# Patient Record
Sex: Male | Born: 1950 | Race: White | Hispanic: No | Marital: Married
Health system: Southern US, Community
[De-identification: ages and names within clinical notes are randomized; demographics above are authoritative.]

---

## 2018-01-29 ENCOUNTER — Other Ambulatory Visit (HOSPITAL_COMMUNITY): Payer: Self-pay

## 2018-01-29 ENCOUNTER — Inpatient Hospital Stay
Admission: RE | Admit: 2018-01-29 | Discharge: 2018-02-14 | Disposition: A | Discharge status: Rehabilitation Facility | Payer: Self-pay | Source: Ambulatory Visit | Attending: Internal Medicine | Admitting: Internal Medicine

## 2018-01-29 DIAGNOSIS — Z978 Presence of other specified devices: Secondary | ICD-10-CM

## 2018-01-29 DIAGNOSIS — Z4659 Encounter for fitting and adjustment of other gastrointestinal appliance and device: Secondary | ICD-10-CM

## 2018-01-29 DIAGNOSIS — Z452 Encounter for adjustment and management of vascular access device: Secondary | ICD-10-CM

## 2018-01-29 LAB — VANCOMYCIN, TROUGH: Vancomycin Tr: <4 ug/mL — ABNORMAL LOW (ref 15–20)

## 2018-01-30 ENCOUNTER — Other Ambulatory Visit (HOSPITAL_COMMUNITY): Payer: Self-pay

## 2018-01-30 LAB — CBC WITH DIFFERENTIAL/PLATELET
BASOS ABS: 0 10*3/uL (ref 0.0–0.1)
Basophils Relative: 0 %
EOS PCT: 0 %
Eosinophils Absolute: 0 10*3/uL (ref 0.0–0.7)
HEMATOCRIT: 35.1 % — AB (ref 39.0–52.0)
Hemoglobin: 10.4 g/dL — ABNORMAL LOW (ref 13.0–17.0)
LYMPHS PCT: 3 %
Lymphs Abs: 0.5 10*3/uL — ABNORMAL LOW (ref 0.7–4.0)
MCH: 25.9 pg — ABNORMAL LOW (ref 26.0–34.0)
MCHC: 29.6 g/dL — ABNORMAL LOW (ref 30.0–36.0)
MCV: 87.3 fL (ref 78.0–100.0)
Monocytes Absolute: 0.4 10*3/uL (ref 0.1–1.0)
Monocytes Relative: 2 %
NEUTROS ABS: 14.4 10*3/uL — AB (ref 1.7–7.7)
Neutrophils Relative %: 95 %
PLATELETS: 136 10*3/uL — AB (ref 150–400)
RBC: 4.02 MIL/uL — AB (ref 4.22–5.81)
RDW: 16.5 % — ABNORMAL HIGH (ref 11.5–15.5)
WBC: 15.2 10*3/uL — AB (ref 4.0–10.5)

## 2018-01-30 LAB — COMPREHENSIVE METABOLIC PANEL
ALT: 18 U/L (ref 17–63)
AST: 17 U/L (ref 15–41)
Albumin: 2.2 g/dL — ABNORMAL LOW (ref 3.5–5.0)
Alkaline Phosphatase: 59 U/L (ref 38–126)
Anion gap: 9 (ref 5–15)
BUN: 29 mg/dL — AB (ref 6–20)
CHLORIDE: 106 mmol/L (ref 101–111)
CO2: 28 mmol/L (ref 22–32)
CREATININE: 0.77 mg/dL (ref 0.61–1.24)
Calcium: 8.4 mg/dL — ABNORMAL LOW (ref 8.9–10.3)
GFR calc Af Amer: >60 mL/min (ref >60)
Glucose, Bld: 99 mg/dL (ref 65–99)
Potassium: 3.9 mmol/L (ref 3.5–5.1)
SODIUM: 143 mmol/L (ref 135–145)
Total Bilirubin: 0.7 mg/dL (ref 0.3–1.2)
Total Protein: 4.8 g/dL — ABNORMAL LOW (ref 6.5–8.1)

## 2018-01-30 LAB — CBC
HCT: 35.7 % — ABNORMAL LOW (ref 39.0–52.0)
Hemoglobin: 10.6 g/dL — ABNORMAL LOW (ref 13.0–17.0)
MCH: 26 pg (ref 26.0–34.0)
MCHC: 29.7 g/dL — AB (ref 30.0–36.0)
MCV: 87.5 fL (ref 78.0–100.0)
PLATELETS: 170 10*3/uL (ref 150–400)
RBC: 4.08 MIL/uL — ABNORMAL LOW (ref 4.22–5.81)
RDW: 16.6 % — AB (ref 11.5–15.5)
WBC: 15.1 10*3/uL — ABNORMAL HIGH (ref 4.0–10.5)

## 2018-01-30 LAB — PROTIME-INR
INR: 0.99
INR: 1.01
Prothrombin Time: 13 seconds (ref 11.4–15.2)
Prothrombin Time: 13.2 seconds (ref 11.4–15.2)

## 2018-01-30 LAB — APTT: aPTT: 24 seconds (ref 24–36)

## 2018-01-30 LAB — HEPARIN LEVEL (UNFRACTIONATED)
Heparin Unfractionated: 0.11 IU/mL — ABNORMAL LOW (ref 0.30–0.70)
Heparin Unfractionated: 0.63 IU/mL (ref 0.30–0.70)

## 2018-01-30 MED ORDER — IOPAMIDOL (ISOVUE-300) INJECTION 61%
INTRAVENOUS | Status: AC
  Filled 2018-01-30: qty 100

## 2018-01-30 MED ORDER — IOPAMIDOL (ISOVUE-300) INJECTION 61%
INTRAVENOUS | Status: AC
  Administered 2018-01-30: 75 mL
  Filled 2018-01-30: qty 75

## 2018-01-31 LAB — CBC
HCT: 31.7 % — ABNORMAL LOW (ref 39.0–52.0)
HEMOGLOBIN: 9.7 g/dL — AB (ref 13.0–17.0)
MCH: 26.2 pg (ref 26.0–34.0)
MCHC: 30.6 g/dL (ref 30.0–36.0)
MCV: 85.7 fL (ref 78.0–100.0)
PLATELETS: 100 10*3/uL — AB (ref 150–400)
RBC: 3.7 MIL/uL — ABNORMAL LOW (ref 4.22–5.81)
RDW: 16.8 % — ABNORMAL HIGH (ref 11.5–15.5)
WBC: 13.3 10*3/uL — ABNORMAL HIGH (ref 4.0–10.5)

## 2018-01-31 LAB — HEPARIN LEVEL (UNFRACTIONATED)
HEPARIN UNFRACTIONATED: 0.82 [IU]/mL — AB (ref 0.30–0.70)
HEPARIN UNFRACTIONATED: 0.92 [IU]/mL — AB (ref 0.30–0.70)
HEPARIN UNFRACTIONATED: 1.1 [IU]/mL — AB (ref 0.30–0.70)
Heparin Unfractionated: <0.1 IU/mL — ABNORMAL LOW (ref 0.30–0.70)

## 2018-02-01 LAB — CBC
HCT: 33 % — ABNORMAL LOW (ref 39.0–52.0)
HEMOGLOBIN: 10 g/dL — AB (ref 13.0–17.0)
MCH: 26 pg (ref 26.0–34.0)
MCHC: 30.3 g/dL (ref 30.0–36.0)
MCV: 85.7 fL (ref 78.0–100.0)
Platelets: 73 10*3/uL — ABNORMAL LOW (ref 150–400)
RBC: 3.85 MIL/uL — ABNORMAL LOW (ref 4.22–5.81)
RDW: 16.8 % — ABNORMAL HIGH (ref 11.5–15.5)
WBC: 13.2 10*3/uL — AB (ref 4.0–10.5)

## 2018-02-01 LAB — HEPARIN LEVEL (UNFRACTIONATED): Heparin Unfractionated: <0.1 IU/mL — ABNORMAL LOW (ref 0.30–0.70)

## 2018-02-01 LAB — HEPARIN INDUCED PLATELET AB (HIT ANTIBODY): HEPARIN INDUCED PLT AB: 0.502 {OD_unit} — AB (ref 0.000–0.400)

## 2018-02-02 ENCOUNTER — Other Ambulatory Visit (HOSPITAL_COMMUNITY): Payer: Self-pay

## 2018-02-02 LAB — CBC WITH DIFFERENTIAL/PLATELET
BASOS PCT: 0 %
Basophils Absolute: 0 10*3/uL (ref 0.0–0.1)
Eosinophils Absolute: 0.1 10*3/uL (ref 0.0–0.7)
Eosinophils Relative: 1 %
HCT: 26.7 % — ABNORMAL LOW (ref 39.0–52.0)
Hemoglobin: 7.8 g/dL — ABNORMAL LOW (ref 13.0–17.0)
LYMPHS PCT: 10 %
Lymphs Abs: 0.7 10*3/uL (ref 0.7–4.0)
MCH: 25.5 pg — ABNORMAL LOW (ref 26.0–34.0)
MCHC: 29.2 g/dL — AB (ref 30.0–36.0)
MCV: 87.3 fL (ref 78.0–100.0)
MONO ABS: 0.3 10*3/uL (ref 0.1–1.0)
MONOS PCT: 5 %
NEUTROS ABS: 5.7 10*3/uL (ref 1.7–7.7)
Neutrophils Relative %: 84 %
Platelets: 68 10*3/uL — ABNORMAL LOW (ref 150–400)
RBC: 3.06 MIL/uL — ABNORMAL LOW (ref 4.22–5.81)
RDW: 16.6 % — AB (ref 11.5–15.5)
WBC: 6.9 10*3/uL (ref 4.0–10.5)

## 2018-02-02 LAB — CREATININE, SERUM
Creatinine, Ser: 0.87 mg/dL (ref 0.61–1.24)
GFR calc Af Amer: >60 mL/min (ref >60)
GFR calc non Af Amer: >60 mL/min (ref >60)

## 2018-02-02 LAB — HEPARIN INDUCED PLATELET AB (HIT ANTIBODY): HEPARIN INDUCED PLT AB: 0.438 {OD_unit} — AB (ref 0.000–0.400)

## 2018-02-02 LAB — APTT: aPTT: 30 seconds (ref 24–36)

## 2018-02-03 LAB — CBC
HCT: 27.1 % — ABNORMAL LOW (ref 39.0–52.0)
Hemoglobin: 8 g/dL — ABNORMAL LOW (ref 13.0–17.0)
MCH: 25.7 pg — ABNORMAL LOW (ref 26.0–34.0)
MCHC: 29.5 g/dL — ABNORMAL LOW (ref 30.0–36.0)
MCV: 87.1 fL (ref 78.0–100.0)
Platelets: 65 10*3/uL — ABNORMAL LOW (ref 150–400)
RBC: 3.11 MIL/uL — AB (ref 4.22–5.81)
RDW: 16.7 % — ABNORMAL HIGH (ref 11.5–15.5)
WBC: 5.2 10*3/uL (ref 4.0–10.5)

## 2018-02-03 LAB — APTT
APTT: 78 s — AB (ref 24–36)
APTT: 85 s — AB (ref 24–36)
APTT: 71 s — AB (ref 24–36)
APTT: 76 s — AB (ref 24–36)
aPTT: 81 seconds — ABNORMAL HIGH (ref 24–36)

## 2018-02-03 LAB — PROTIME-INR
INR: 2.91
Prothrombin Time: 30.2 seconds — ABNORMAL HIGH (ref 11.4–15.2)

## 2018-02-04 ENCOUNTER — Encounter: Payer: Self-pay | Admitting: Anesthesiology

## 2018-02-04 ENCOUNTER — Other Ambulatory Visit (HOSPITAL_COMMUNITY): Payer: Self-pay

## 2018-02-04 LAB — PROTIME-INR
INR: 3.41
Prothrombin Time: 34.1 seconds — ABNORMAL HIGH (ref 11.4–15.2)

## 2018-02-04 LAB — APTT: aPTT: 86 seconds — ABNORMAL HIGH (ref 24–36)

## 2018-02-04 MED ORDER — PHENYLEPHRINE HCL 10 MG/ML IJ SOLN
INTRAMUSCULAR | Status: DC | PRN
  Administered 2018-02-04: 80 ug via INTRAVENOUS

## 2018-02-04 MED ORDER — PROPOFOL 10 MG/ML IV BOLUS
INTRAVENOUS | Status: DC | PRN
  Administered 2018-02-04: 50 mg via INTRAVENOUS

## 2018-02-04 NOTE — Anesthesia Procedure Notes (Signed)
Procedure Name: Intubation Date/Time: 02/04/2018 8:39 PM Performed by: Edmonia CaprioAuston, [name], CRNA Pre-anesthesia Checklist: Patient identified, Emergency Drugs available, Suction available, Patient being monitored and Timeout performed Patient Re-evaluated:Patient Re-evaluated prior to induction Oxygen Delivery Method: Ambu bag Preoxygenation: Pre-oxygenation with 100% oxygen Induction Type: IV induction Laryngoscope Size: Glidescope and 3 Grade View: Grade I Tube type: Subglottic suction tube Tube size: 7.5 mm Number of attempts: 1 Airway Equipment and Method: Bougie stylet and Video-laryngoscopy Placement Confirmation: ETT inserted through vocal cords under direct vision,  positive ETCO2 and breath sounds checked- equal and bilateral Secured at: 24 cm Tube secured with: Tape Dental Injury: Teeth and Oropharynx as per pre-operative assessment  Comments: Existing ETT cuff not holding air.  Visualized cords with glidescope, exchanged new ETT over cook catheter.  +ETc02. +BBS. VSS throughout.

## 2018-02-05 LAB — CBC
HCT: 24.2 % — ABNORMAL LOW (ref 39.0–52.0)
Hemoglobin: 7.3 g/dL — ABNORMAL LOW (ref 13.0–17.0)
MCH: 26.2 pg (ref 26.0–34.0)
MCHC: 30.2 g/dL (ref 30.0–36.0)
MCV: 86.7 fL (ref 78.0–100.0)
PLATELETS: 72 10*3/uL — AB (ref 150–400)
RBC: 2.79 MIL/uL — AB (ref 4.22–5.81)
RDW: 17.2 % — AB (ref 11.5–15.5)
WBC: 5.6 10*3/uL (ref 4.0–10.5)

## 2018-02-05 LAB — PROTIME-INR
INR: 3.72
PROTHROMBIN TIME: 36.6 s — AB (ref 11.4–15.2)

## 2018-02-05 LAB — APTT
APTT: 93 s — AB (ref 24–36)
aPTT: 70 seconds — ABNORMAL HIGH (ref 24–36)
aPTT: 92 seconds — ABNORMAL HIGH (ref 24–36)

## 2018-02-06 LAB — CBC
HCT: 20 % — ABNORMAL LOW (ref 39.0–52.0)
HCT: 23.5 % — ABNORMAL LOW (ref 39.0–52.0)
HEMATOCRIT: 20 % — AB (ref 39.0–52.0)
HEMOGLOBIN: 6 g/dL — AB (ref 13.0–17.0)
HEMOGLOBIN: 6.1 g/dL — AB (ref 13.0–17.0)
HEMOGLOBIN: 7.5 g/dL — AB (ref 13.0–17.0)
MCH: 25.9 pg — AB (ref 26.0–34.0)
MCH: 26.5 pg (ref 26.0–34.0)
MCH: 27.4 pg (ref 26.0–34.0)
MCHC: 30 g/dL (ref 30.0–36.0)
MCHC: 30.5 g/dL (ref 30.0–36.0)
MCHC: 31.9 g/dL (ref 30.0–36.0)
MCV: 86.2 fL (ref 78.0–100.0)
MCV: 87 fL (ref 78.0–100.0)
MCV: 85.8 fL (ref 78.0–100.0)
PLATELETS: 107 10*3/uL — AB (ref 150–400)
Platelets: 114 10*3/uL — ABNORMAL LOW (ref 150–400)
Platelets: 101 10*3/uL — ABNORMAL LOW (ref 150–400)
RBC: 2.32 MIL/uL — ABNORMAL LOW (ref 4.22–5.81)
RBC: 2.3 MIL/uL — ABNORMAL LOW (ref 4.22–5.81)
RBC: 2.74 MIL/uL — AB (ref 4.22–5.81)
RDW: 17.7 % — AB (ref 11.5–15.5)
RDW: 18 % — AB (ref 11.5–15.5)
RDW: 16.8 % — ABNORMAL HIGH (ref 11.5–15.5)
WBC: 5.8 10*3/uL (ref 4.0–10.5)
WBC: 5.8 10*3/uL (ref 4.0–10.5)
WBC: 6.3 10*3/uL (ref 4.0–10.5)

## 2018-02-06 LAB — RESPIRATORY PANEL BY PCR
ADENOVIRUS-RVPPCR: NOT DETECTED
Bordetella pertussis: NOT DETECTED
CORONAVIRUS NL63-RVPPCR: NOT DETECTED
CORONAVIRUS OC43-RVPPCR: NOT DETECTED
Chlamydophila pneumoniae: NOT DETECTED
Coronavirus 229E: NOT DETECTED
Coronavirus HKU1: NOT DETECTED
INFLUENZA A H3-RVPPCR: DETECTED — AB
Influenza B: NOT DETECTED
Metapneumovirus: NOT DETECTED
Mycoplasma pneumoniae: NOT DETECTED
PARAINFLUENZA VIRUS 1-RVPPCR: NOT DETECTED
PARAINFLUENZA VIRUS 3-RVPPCR: NOT DETECTED
PARAINFLUENZA VIRUS 4-RVPPCR: NOT DETECTED
Parainfluenza Virus 2: NOT DETECTED
RHINOVIRUS / ENTEROVIRUS - RVPPCR: NOT DETECTED
Respiratory Syncytial Virus: NOT DETECTED

## 2018-02-06 LAB — ABO/RH: ABO/RH(D): O POS

## 2018-02-06 LAB — PROTIME-INR
INR: 3.47
PROTHROMBIN TIME: 34.6 s — AB (ref 11.4–15.2)

## 2018-02-06 LAB — OCCULT BLOOD X 1 CARD TO LAB, STOOL: Fecal Occult Bld: POSITIVE — AB

## 2018-02-06 LAB — APTT
APTT: 81 s — AB (ref 24–36)
aPTT: 80 seconds — ABNORMAL HIGH (ref 24–36)

## 2018-02-06 LAB — C DIFFICILE QUICK SCREEN W PCR REFLEX
C Diff antigen: NEGATIVE
C Diff interpretation: NOT DETECTED
C Diff toxin: NEGATIVE

## 2018-02-06 LAB — PREPARE RBC (CROSSMATCH)

## 2018-02-07 LAB — CBC
HCT: 22.7 % — ABNORMAL LOW (ref 39.0–52.0)
HCT: 24.2 % — ABNORMAL LOW (ref 39.0–52.0)
HEMATOCRIT: 22.9 % — AB (ref 39.0–52.0)
HEMOGLOBIN: 7.1 g/dL — AB (ref 13.0–17.0)
HEMOGLOBIN: 7.2 g/dL — AB (ref 13.0–17.0)
Hemoglobin: 7 g/dL — ABNORMAL LOW (ref 13.0–17.0)
MCH: 26.9 pg (ref 26.0–34.0)
MCH: 25.7 pg — ABNORMAL LOW (ref 26.0–34.0)
MCH: 26.3 pg (ref 26.0–34.0)
MCHC: 31.3 g/dL (ref 30.0–36.0)
MCHC: 29.8 g/dL — ABNORMAL LOW (ref 30.0–36.0)
MCHC: 30.6 g/dL (ref 30.0–36.0)
MCV: 86 fL (ref 78.0–100.0)
MCV: 86.4 fL (ref 78.0–100.0)
MCV: 86.1 fL (ref 78.0–100.0)
Platelets: 99 10*3/uL — ABNORMAL LOW (ref 150–400)
Platelets: 120 10*3/uL — ABNORMAL LOW (ref 150–400)
Platelets: 124 10*3/uL — ABNORMAL LOW (ref 150–400)
RBC: 2.64 MIL/uL — ABNORMAL LOW (ref 4.22–5.81)
RBC: 2.8 MIL/uL — ABNORMAL LOW (ref 4.22–5.81)
RBC: 2.66 MIL/uL — AB (ref 4.22–5.81)
RDW: 17.1 % — ABNORMAL HIGH (ref 11.5–15.5)
RDW: 17 % — ABNORMAL HIGH (ref 11.5–15.5)
RDW: 16.9 % — ABNORMAL HIGH (ref 11.5–15.5)
WBC: 5.6 10*3/uL (ref 4.0–10.5)
WBC: 6 10*3/uL (ref 4.0–10.5)
WBC: 5.9 10*3/uL (ref 4.0–10.5)

## 2018-02-07 LAB — PROTIME-INR
INR: 1.33
PROTHROMBIN TIME: 16.4 s — AB (ref 11.4–15.2)

## 2018-02-08 LAB — PROTIME-INR
INR: 1.15
PROTHROMBIN TIME: 14.6 s (ref 11.4–15.2)

## 2018-02-08 LAB — BASIC METABOLIC PANEL
ANION GAP: 12 (ref 5–15)
BUN: 76 mg/dL — AB (ref 6–20)
CHLORIDE: 99 mmol/L — AB (ref 101–111)
CO2: 30 mmol/L (ref 22–32)
Calcium: 9 mg/dL (ref 8.9–10.3)
Creatinine, Ser: 1.62 mg/dL — ABNORMAL HIGH (ref 0.61–1.24)
GFR, EST AFRICAN AMERICAN: 49 mL/min — AB (ref >60)
GFR, EST NON AFRICAN AMERICAN: 42 mL/min — AB (ref >60)
Glucose, Bld: 110 mg/dL — ABNORMAL HIGH (ref 65–99)
Potassium: 4.9 mmol/L (ref 3.5–5.1)
SODIUM: 141 mmol/L (ref 135–145)

## 2018-02-09 LAB — CBC
HCT: 21 % — ABNORMAL LOW (ref 39.0–52.0)
Hemoglobin: 6.5 g/dL — CL (ref 13.0–17.0)
MCH: 27.2 pg (ref 26.0–34.0)
MCHC: 31 g/dL (ref 30.0–36.0)
MCV: 87.9 fL (ref 78.0–100.0)
Platelets: 163 10*3/uL (ref 150–400)
RBC: 2.39 MIL/uL — ABNORMAL LOW (ref 4.22–5.81)
RDW: 17.6 % — AB (ref 11.5–15.5)
WBC: 6 10*3/uL (ref 4.0–10.5)

## 2018-02-09 LAB — PREPARE RBC (CROSSMATCH)

## 2018-02-10 LAB — BPAM RBC
BLOOD PRODUCT EXPIRATION DATE: 201903272359
Blood Product Expiration Date: 201903022359
Blood Product Expiration Date: 201903182359
ISSUE DATE / TIME: 201903020645
ISSUE DATE / TIME: 201902261606
ISSUE DATE / TIME: 201903010917
UNIT TYPE AND RH: 5100
UNIT TYPE AND RH: 9500
Unit Type and Rh: 5100

## 2018-02-10 LAB — CBC
HCT: 24.7 % — ABNORMAL LOW (ref 39.0–52.0)
Hemoglobin: 7.6 g/dL — ABNORMAL LOW (ref 13.0–17.0)
MCH: 27.1 pg (ref 26.0–34.0)
MCHC: 30.8 g/dL (ref 30.0–36.0)
MCV: 88.2 fL (ref 78.0–100.0)
Platelets: 212 10*3/uL (ref 150–400)
RBC: 2.8 MIL/uL — AB (ref 4.22–5.81)
RDW: 17 % — ABNORMAL HIGH (ref 11.5–15.5)
WBC: 7.2 10*3/uL (ref 4.0–10.5)

## 2018-02-10 LAB — TYPE AND SCREEN
ABO/RH(D): O POS
Antibody Screen: NEGATIVE
UNIT DIVISION: 0
UNIT DIVISION: 0
Unit division: 0

## 2018-02-10 LAB — BASIC METABOLIC PANEL
ANION GAP: 11 (ref 5–15)
BUN: 59 mg/dL — ABNORMAL HIGH (ref 6–20)
CO2: 29 mmol/L (ref 22–32)
Calcium: 8.9 mg/dL (ref 8.9–10.3)
Chloride: 102 mmol/L (ref 101–111)
Creatinine, Ser: 1.29 mg/dL — ABNORMAL HIGH (ref 0.61–1.24)
GFR, EST NON AFRICAN AMERICAN: 56 mL/min — AB (ref >60)
GLUCOSE: 123 mg/dL — AB (ref 65–99)
POTASSIUM: 3.7 mmol/L (ref 3.5–5.1)
SODIUM: 142 mmol/L (ref 135–145)

## 2018-02-11 LAB — CBC
HEMATOCRIT: 25 % — AB (ref 39.0–52.0)
HEMOGLOBIN: 7.4 g/dL — AB (ref 13.0–17.0)
MCH: 26.7 pg (ref 26.0–34.0)
MCHC: 29.6 g/dL — ABNORMAL LOW (ref 30.0–36.0)
MCV: 90.3 fL (ref 78.0–100.0)
Platelets: 286 10*3/uL (ref 150–400)
RBC: 2.77 MIL/uL — AB (ref 4.22–5.81)
RDW: 17.1 % — ABNORMAL HIGH (ref 11.5–15.5)
WBC: 8.1 10*3/uL (ref 4.0–10.5)

## 2018-02-11 LAB — OCCULT BLOOD X 1 CARD TO LAB, STOOL: FECAL OCCULT BLD: POSITIVE — AB

## 2018-02-13 LAB — CBC
HCT: 24 % — ABNORMAL LOW (ref 39.0–52.0)
HEMOGLOBIN: 7.2 g/dL — AB (ref 13.0–17.0)
MCH: 27.5 pg (ref 26.0–34.0)
MCHC: 30 g/dL (ref 30.0–36.0)
MCV: 91.6 fL (ref 78.0–100.0)
Platelets: 440 10*3/uL — ABNORMAL HIGH (ref 150–400)
RBC: 2.62 MIL/uL — ABNORMAL LOW (ref 4.22–5.81)
RDW: 17.8 % — ABNORMAL HIGH (ref 11.5–15.5)
WBC: 11.8 10*3/uL — ABNORMAL HIGH (ref 4.0–10.5)

## 2018-02-14 ENCOUNTER — Other Ambulatory Visit (HOSPITAL_COMMUNITY): Payer: Self-pay

## 2018-02-14 ENCOUNTER — Inpatient Hospital Stay
Admission: RE | Admit: 2018-02-14 | Discharge: 2018-03-12 | Disposition: E | Payer: Self-pay | Source: Ambulatory Visit | Attending: Internal Medicine | Admitting: Internal Medicine

## 2018-02-14 ENCOUNTER — Encounter (HOSPITAL_COMMUNITY)
Admission: RE | Disposition: A | Discharge status: Rehabilitation Facility | Payer: Self-pay | Source: Ambulatory Visit | Attending: Internal Medicine

## 2018-02-14 ENCOUNTER — Encounter (HOSPITAL_COMMUNITY): Payer: Self-pay | Admitting: Certified Registered"

## 2018-02-14 ENCOUNTER — Ambulatory Visit (HOSPITAL_COMMUNITY): Payer: Self-pay | Admitting: Certified Registered"

## 2018-02-14 DIAGNOSIS — Z4659 Encounter for fitting and adjustment of other gastrointestinal appliance and device: Secondary | ICD-10-CM

## 2018-02-14 DIAGNOSIS — R0902 Hypoxemia: Secondary | ICD-10-CM

## 2018-02-14 DIAGNOSIS — Z931 Gastrostomy status: Secondary | ICD-10-CM

## 2018-02-14 HISTORY — PX: TRACHEOSTOMY TUBE PLACEMENT: SHX814

## 2018-02-14 LAB — BLOOD GAS, ARTERIAL
ACID-BASE EXCESS: 12.8 mmol/L — AB (ref 0.0–2.0)
Bicarbonate: 37.8 mmol/L — ABNORMAL HIGH (ref 20.0–28.0)
FIO2: 100
MECHVT: 500 mL
O2 Saturation: 97.2 %
PCO2 ART: 61.2 mmHg — AB (ref 32.0–48.0)
PEEP: 7 cmH2O
PH ART: 7.413 (ref 7.350–7.450)
PO2 ART: 104 mmHg (ref 83.0–108.0)
Patient temperature: 100.5
RATE: 18 resp/min

## 2018-02-14 SURGERY — CREATION, TRACHEOSTOMY
Anesthesia: General | Site: Throat

## 2018-02-14 MED ORDER — STERILE WATER FOR IRRIGATION IR SOLN
Status: DC | PRN
  Administered 2018-02-14: 1000 mL

## 2018-02-14 MED ORDER — 0.9 % SODIUM CHLORIDE (POUR BTL) OPTIME
TOPICAL | Status: DC | PRN
  Administered 2018-02-14: 1000 mL

## 2018-02-14 MED ORDER — MIDAZOLAM HCL 2 MG/2ML IJ SOLN
INTRAMUSCULAR | Status: DC | PRN
  Administered 2018-02-14: 4 mg via INTRAVENOUS

## 2018-02-14 MED ORDER — FENTANYL CITRATE (PF) 250 MCG/5ML IJ SOLN
INTRAMUSCULAR | Status: AC
  Filled 2018-02-14: qty 5

## 2018-02-14 MED ORDER — ROCURONIUM BROMIDE 100 MG/10ML IV SOLN
INTRAVENOUS | Status: DC | PRN
  Administered 2018-02-14: 50 mg via INTRAVENOUS

## 2018-02-14 MED ORDER — ALBUTEROL SULFATE HFA 108 (90 BASE) MCG/ACT IN AERS
INHALATION_SPRAY | RESPIRATORY_TRACT | Status: DC | PRN
  Administered 2018-02-14 (×2): 2 via RESPIRATORY_TRACT

## 2018-02-14 MED ORDER — PROPOFOL 10 MG/ML IV BOLUS
INTRAVENOUS | Status: AC
  Filled 2018-02-14: qty 20

## 2018-02-14 MED ORDER — LIDOCAINE-EPINEPHRINE 1 %-1:100000 IJ SOLN
INTRAMUSCULAR | Status: DC | PRN
  Administered 2018-02-14: 4 mL

## 2018-02-14 MED ORDER — LIDOCAINE-EPINEPHRINE 1 %-1:100000 IJ SOLN
INTRAMUSCULAR | Status: AC
  Filled 2018-02-14: qty 1

## 2018-02-14 MED ORDER — LIDOCAINE 2% (20 MG/ML) 5 ML SYRINGE
INTRAMUSCULAR | Status: AC
  Filled 2018-02-14: qty 5

## 2018-02-14 MED ORDER — MIDAZOLAM HCL 2 MG/2ML IJ SOLN
INTRAMUSCULAR | Status: AC
  Filled 2018-02-14: qty 2

## 2018-02-14 MED ORDER — FENTANYL CITRATE (PF) 250 MCG/5ML IJ SOLN
INTRAMUSCULAR | Status: DC | PRN
  Administered 2018-02-14: 100 ug via INTRAVENOUS
  Administered 2018-02-14: 50 ug via INTRAVENOUS

## 2018-02-14 MED ORDER — LACTATED RINGERS IV SOLN
INTRAVENOUS | Status: DC | PRN
  Administered 2018-02-14: 14:00:00 via INTRAVENOUS

## 2018-02-14 SURGICAL SUPPLY — 38 items
ATTRACTOMAT 16X20 MAGNETIC DRP (DRAPES) ×3 IMPLANT
BLADE SURG 15 STRL LF DISP TIS (BLADE) ×1 IMPLANT
BLADE SURG 15 STRL SS (BLADE) ×2
CLEANER TIP ELECTROSURG 2X2 (MISCELLANEOUS) ×3 IMPLANT
COVER SURGICAL LIGHT HANDLE (MISCELLANEOUS) ×3 IMPLANT
DRAPE HALF SHEET 40X57 (DRAPES) IMPLANT
ELECT COATED BLADE 2.86 ST (ELECTRODE) ×3 IMPLANT
ELECT REM PT RETURN 9FT ADLT (ELECTROSURGICAL) ×3
ELECTRODE REM PT RTRN 9FT ADLT (ELECTROSURGICAL) ×1 IMPLANT
GAUZE SPONGE 4X4 16PLY XRAY LF (GAUZE/BANDAGES/DRESSINGS) ×3 IMPLANT
GEL ULTRASOUND 20GR AQUASONIC (MISCELLANEOUS) ×3 IMPLANT
GLOVE SS BIOGEL STRL SZ 7.5 (GLOVE) ×1 IMPLANT
GLOVE SUPERSENSE BIOGEL SZ 7.5 (GLOVE) ×2
GOWN STRL REUS W/ TWL LRG LVL3 (GOWN DISPOSABLE) ×1 IMPLANT
GOWN STRL REUS W/ TWL XL LVL3 (GOWN DISPOSABLE) ×1 IMPLANT
GOWN STRL REUS W/TWL LRG LVL3 (GOWN DISPOSABLE) ×2
GOWN STRL REUS W/TWL XL LVL3 (GOWN DISPOSABLE) ×2
HOLDER TRACH TUBE VELCRO 19.5 (MISCELLANEOUS) ×3 IMPLANT
KIT BASIN OR (CUSTOM PROCEDURE TRAY) ×3 IMPLANT
KIT ROOM TURNOVER OR (KITS) ×3 IMPLANT
KIT SUCTION CATH 14FR (SUCTIONS) ×3 IMPLANT
NEEDLE HYPO 25GX1X1/2 BEV (NEEDLE) ×3 IMPLANT
NS IRRIG 1000ML POUR BTL (IV SOLUTION) ×3 IMPLANT
PACK EENT II TURBAN DRAPE (CUSTOM PROCEDURE TRAY) ×3 IMPLANT
PAD ARMBOARD 7.5X6 YLW CONV (MISCELLANEOUS) IMPLANT
PENCIL BUTTON HOLSTER BLD 10FT (ELECTRODE) ×3 IMPLANT
SPONGE DRAIN TRACH 4X4 STRL 2S (GAUZE/BANDAGES/DRESSINGS) ×3 IMPLANT
SPONGE INTESTINAL PEANUT (DISPOSABLE) ×3 IMPLANT
SUT SILK 2 0 SH CR/8 (SUTURE) ×3 IMPLANT
SUT SILK 3 0 TIES 10X30 (SUTURE) IMPLANT
SYR 5ML LUER SLIP (SYRINGE) ×3 IMPLANT
SYR CONTROL 10ML LL (SYRINGE) ×3 IMPLANT
TOWEL OR 17X24 6PK STRL BLUE (TOWEL DISPOSABLE) ×3 IMPLANT
TOWEL OR 17X26 10 PK STRL BLUE (TOWEL DISPOSABLE) IMPLANT
TUBE CONNECTING 12'X1/4 (SUCTIONS) ×1
TUBE CONNECTING 12X1/4 (SUCTIONS) ×2 IMPLANT
TUBE TRACH SHILEY  6 DIST  CUF (TUBING) ×3 IMPLANT
TUBE TRACH SHILEY 8 DIST CUF (TUBING) IMPLANT

## 2018-02-14 NOTE — Anesthesia Procedure Notes (Signed)
Date/Time: 02/10/2018 2:15 PM Performed by: Rosiland OzMeyers, Tiajuana Leppanen, CRNA Pre-anesthesia Checklist: Patient identified, Emergency Drugs available, Suction available, Patient being monitored and Timeout performed Patient Re-evaluated:Patient Re-evaluated prior to induction Oxygen Delivery Method: Circle system utilized Preoxygenation: Pre-oxygenation with 100% oxygen Induction Type: Inhalational induction Comments: Indwelling ETT

## 2018-02-14 NOTE — Anesthesia Preprocedure Evaluation (Addendum)
Anesthesia Evaluation  Patient identified by MRN, date of birth, ID bandGeneral Assessment Comment:Pt sedated and intubated  Reviewed: Allergy & Precautions, NPO status , Patient's Chart, lab work & pertinent test results, reviewed documented beta blocker date and time , Unable to perform ROS - Chart review only  History of Anesthesia Complications Negative for: history of anesthetic complications  Airway Mallampati: Intubated  TM Distance: >3 FB     Dental   Pulmonary pneumonia, COPD,  oxygen dependent, PE VDRF   breath sounds clear to auscultation       Cardiovascular hypertension, Pt. on home beta blockers and Pt. on medications (-) angina Rhythm:Regular Rate:Normal  '18 ECHO: EF 55-60%, valves OK   Neuro/Psych Sedated with intubation    GI/Hepatic Neg liver ROS, GERD (esophageal stricture)  ,H/o colon resection, has NG tube presently   Endo/Other  negative endocrine ROS  Renal/GU Renal InsufficiencyRenal disease (creat 1.29)     Musculoskeletal   Abdominal   Peds  Hematology  (+) Blood dyscrasia (Hb 7.2), anemia ,   Anesthesia Other Findings   Reproductive/Obstetrics                            Anesthesia Physical Anesthesia Plan  ASA: IV  Anesthesia Plan: General   Post-op Pain Management:    Induction: Inhalational  PONV Risk Score and Plan: 2 and Treatment may vary due to age or medical condition  Airway Management Planned: Tracheostomy  Additional Equipment:   Intra-op Plan:   Post-operative Plan: Post-operative intubation/ventilation  Informed Consent:   History available from chart only  Plan Discussed with: CRNA and Surgeon  Anesthesia Plan Comments: (Plan routine monitors, GETA with existing ETT)       Anesthesia Quick Evaluation

## 2018-02-14 NOTE — Interval H&P Note (Signed)
History and Physical Interval Note:  03/10/2018 2:07 PM  Colin Maxwell  has presented today for surgery, with the diagnosis of RESPIRATORY FAILURE  The various methods of treatment have been discussed with the patient and family. After consideration of risks, benefits and other options for treatment, the patient has consented to  Procedure(s): TRACHEOSTOMY (N/A) as a surgical intervention .  The patient's history has been reviewed, patient examined, no change in status, stable for surgery.  I have reviewed the patient's chart and labs.  Questions were answered to the patient's satisfaction.     Dillard Cannonhristopher Newman

## 2018-02-14 NOTE — Transfer of Care (Signed)
Immediate Anesthesia Transfer of Care Note  Patient: Colin Maxwell  Procedure(s) Performed: TRACHEOSTOMY (N/A Throat)  Patient Location: Nursing Unit  Anesthesia Type:General  Level of Consciousness: sedated, patient cooperative and Patient remains intubated per anesthesia plan  Airway & Oxygen Therapy: Patient remains intubated per anesthesia plan and Patient placed on Ventilator (see vital sign flow sheet for setting)  Post-op Assessment: Report given to RN and Post -op Vital signs reviewed and stable  Post vital signs: Reviewed and stable  Last Vitals: There were no vitals filed for this visit.  Last Pain: There were no vitals filed for this visit.       Complications: No apparent anesthesia complications

## 2018-02-14 NOTE — Anesthesia Postprocedure Evaluation (Signed)
Anesthesia Post Note  Patient: Colin ShookJames C Maxwell  Procedure(s) Performed: TRACHEOSTOMY (N/A Throat)     Patient location during evaluation: Nursing Unit Anesthesia Type: General Level of consciousness: sedated and patient remains intubated per anesthesia plan Pain management: pain level controlled Vital Signs Assessment: post-procedure vital signs reviewed and stable Respiratory status: patient on ventilator - see flowsheet for VS and patient remains intubated per anesthesia plan Cardiovascular status: blood pressure returned to baseline and stable Postop Assessment: no apparent nausea or vomiting Anesthetic complications: no Comments: Discussed management with family, who are very appreciative of care for patient    Last Vitals: There were no vitals filed for this visit.  Last Pain: There were no vitals filed for this visit.               Colin PomfretJACKSON,E. Kylei Purington

## 2018-02-14 NOTE — H&P (Signed)
PREOPERATIVE H&P  Chief Complaint: Acute on chronic respiratory failure  HPI: Colin Maxwell is a 67 y.o. male who presents for evaluation of acute on chronic respiratory failure. Patient has a long history of COPD and was noted to outside hospital with pneumonia and respiratory failure. He subsequently intubation in the ICU. He was also diagnosed with PE. He was treated with heparin but subsequently developed heparin-induced thrombocytopenia and this was ceased. He has been intubated for over 2 weeks and has not tolerated weaning off the ventilator. [facility] intubated and on vent. It has been recommended that he have a tracheotomy performed.  No past medical history on file.  Social History   Socioeconomic History  . Marital status: Unknown    Spouse name: Not on file  . Number of children: Not on file  . Years of education: Not on file  . Highest education level: Not on file  Social Needs  . Financial resource strain: Not on file  . Food insecurity - worry: Not on file  . Food insecurity - inability: Not on file  . Transportation needs - medical: Not on file  . Transportation needs - non-medical: Not on file  Occupational History  . Not on file  Tobacco Use  . Smoking status: Not on file  Substance and Sexual Activity  . Alcohol use: Not on file  . Drug use: Not on file  . Sexual activity: Not on file  Other Topics Concern  . Not on file  Social History Narrative  . Not on file   No family history on file. Allergies  Allergen Reactions  . Diphenhydramine Itching and Swelling    SWELLING REACTION UNSPECIFIED   . Iodinated Diagnostic Agents Other (See Comments)    COPD worse for a couple of days after  . Metoclopramide     UNSPECIFIED REACTION   . Morphine And Related Itching  . Zolpidem Other (See Comments)    Patient states that he goes crazy    Prior to Admission medications   Not on File     Positive ROS:  neg  All other systems have been reviewed and were otherwise negative with the exception of those mentioned in the HPI and as above.  Physical Exam: There were no vitals filed for this visit.  General: Intubated and sedated Nasal: Clear nasal passages Neck: No palpable adenopathy or thyroid nodules. Trachea midline. Cardiovascular: Regular rate and rhythm, no murmur.  Respiratory: Clear to auscultation   Assessment/Plan: RESPIRATORY FAILURE Plan for Procedure(s): TRACHEOSTOMY   Colin Cannonhristopher Jadyn Barge, MD 02/27/2018 12:51 PM

## 2018-02-14 NOTE — Brief Op Note (Signed)
02/20/2018  2:35 PM  PATIENT:  Colin Maxwell  67 y.o. male  PRE-OPERATIVE DIAGNOSIS:  RESPIRATORY FAILURE  POST-OPERATIVE DIAGNOSIS:  RESPIRATORY FAILURE  PROCEDURE:  Procedure(s): TRACHEOSTOMY (N/A)  # 6 Shiley cuffed  SURGEON:  Surgeon(s) and Role:    Drema Halon* Newman, Christopher E, MD - Primary  PHYSICIAN ASSISTANT:   ASSISTANTS: none   ANESTHESIA:   general  EBL:  minimal   BLOOD ADMINISTERED:none  DRAINS: none   LOCAL MEDICATIONS USED:  XYLOCAINE with EPI  4 cc  SPECIMEN:  No Specimen  DISPOSITION OF SPECIMEN:  N/A  COUNTS:  YES  TOURNIQUET:  * No tourniquets in log *  DICTATION: .Other Dictation: Dictation Number [id number]  PLAN OF CARE: Discharge to home after PACU  PATIENT DISPOSITION:  PACU - hemodynamically stable.   Delay start of Pharmacological VTE agent (>24hrs) due to surgical blood loss or risk of bleeding: yes

## 2018-02-15 ENCOUNTER — Encounter (HOSPITAL_COMMUNITY): Payer: Self-pay | Admitting: Otolaryngology

## 2018-02-15 LAB — URINALYSIS, ROUTINE W REFLEX MICROSCOPIC
BILIRUBIN URINE: NEGATIVE
Glucose, UA: NEGATIVE mg/dL
Ketones, ur: NEGATIVE mg/dL
NITRITE: NEGATIVE
PH: 7 (ref 5.0–8.0)
Protein, ur: 30 mg/dL — AB
Specific Gravity, Urine: 1.013 (ref 1.005–1.030)

## 2018-02-15 NOTE — Op Note (Signed)
NAME:  Colin Maxwell, Colin Maxwell                    ACCOUNT NO.:  MEDICAL RECORD NO.:  [id number]  LOCATION:                                 FACILITY:  PHYSICIAN:  Kristine GarbeChristopher E. Ezzard StandingNewman, M.D. DATE OF BIRTH:  DATE OF PROCEDURE:  November 27, 2018 DATE OF DISCHARGE:                              OPERATIVE REPORT   PREOPERATIVE DIAGNOSIS:  Acute on chronic respiratory failure.  POSTOPERATIVE DIAGNOSIS:  Acute on chronic respiratory failure.  OPERATION PERFORMED:  Tracheotomy with a #6 Shiley cuffed tube.  SURGEON:  Kristine GarbeChristopher E. Ezzard StandingNewman, M.D.  ANESTHESIA:  General endotracheal.  COMPLICATIONS:  None.  BLOOD LOSS:  Minimal.  BRIEF CLINICAL NOTE:  Colin Maxwell is a 67 year old gentleman with longstanding history of O2 dependent COPD.  Recently developed problems with pneumonia and a PE requiring outside hospitalization and intubation in ICU.  Attempts at weaning the patient were unsuccessful and the patient was subsequently [facility] a little over 2 weeks ago and intubated on vent.  Attempts at weaning the patient off the vent had been unsuccessful and a tracheotomy was recommended.  He was taken to the operating room at this time for a tracheotomy.  DESCRIPTION OF PROCEDURE:  [facility] the [facility] remaining in his bed.  A roll was placed under the shoulders to extend his neck.  The trachea was midline with no surrounding masses.  The proposed incision site was marked out vertically just below the cricoid cartilage and injected with 4 mL of Xylocaine with epinephrine for hemostasis.  The area was then prepped with Betadine solution and draped out with sterile towels.  A vertical incision was made.  Dissection was carried down with the cautery to the subcutaneous tissue.  Strap muscles were divided in the midline and retracted laterally.  The upper 3 tracheal rings were exposed by dividing the thyroid isthmus  with the cautery.  A horizontal tracheotomy was made between the 1st and 2nd tracheal rings.  A small flap was created inferiorly with scissors.  A #6 cuffed Shiley endotracheal tube was inserted without any difficulty.  This was secured to the neck with 2-0 silk sutures x4 and Velcro trach collar.  The patient had no significant bleeding.  The patient was ventilated well and was subsequently [facility].          ______________________________ Kristine Garbehristopher E. Ezzard StandingNewman, M.D.     CEN/MEDQ  D:  November 27, 2018  T:  November 27, 2018  Job:  [id number]

## 2018-02-16 ENCOUNTER — Encounter (HOSPITAL_BASED_OUTPATIENT_CLINIC_OR_DEPARTMENT_OTHER): Payer: Self-pay

## 2018-02-16 DIAGNOSIS — I2699 Other pulmonary embolism without acute cor pulmonale: Secondary | ICD-10-CM

## 2018-02-16 LAB — BLOOD CULTURE ID PANEL (REFLEXED)
Acinetobacter baumannii: NOT DETECTED
CANDIDA PARAPSILOSIS: NOT DETECTED
CANDIDA TROPICALIS: NOT DETECTED
Candida albicans: NOT DETECTED
Candida glabrata: NOT DETECTED
Candida krusei: NOT DETECTED
Carbapenem resistance: NOT DETECTED
ENTEROBACTERIACEAE SPECIES: NOT DETECTED
ENTEROCOCCUS SPECIES: NOT DETECTED
Enterobacter cloacae complex: NOT DETECTED
Escherichia coli: NOT DETECTED
HAEMOPHILUS INFLUENZAE: NOT DETECTED
KLEBSIELLA PNEUMONIAE: NOT DETECTED
Klebsiella oxytoca: NOT DETECTED
Listeria monocytogenes: NOT DETECTED
METHICILLIN RESISTANCE: DETECTED — AB
Neisseria meningitidis: NOT DETECTED
PSEUDOMONAS AERUGINOSA: NOT DETECTED
Proteus species: NOT DETECTED
STAPHYLOCOCCUS AUREUS BCID: NOT DETECTED
STREPTOCOCCUS PNEUMONIAE: NOT DETECTED
Serratia marcescens: NOT DETECTED
Staphylococcus species: DETECTED — AB
Streptococcus agalactiae: NOT DETECTED
Streptococcus pyogenes: NOT DETECTED
Streptococcus species: NOT DETECTED

## 2018-02-16 LAB — BASIC METABOLIC PANEL
ANION GAP: 11 (ref 5–15)
BUN: 22 mg/dL — ABNORMAL HIGH (ref 6–20)
CO2: 35 mmol/L — ABNORMAL HIGH (ref 22–32)
Calcium: 8.6 mg/dL — ABNORMAL LOW (ref 8.9–10.3)
Chloride: 101 mmol/L (ref 101–111)
Creatinine, Ser: 0.79 mg/dL (ref 0.61–1.24)
GFR calc Af Amer: >60 mL/min (ref >60)
GLUCOSE: 112 mg/dL — AB (ref 65–99)
Potassium: 3 mmol/L — ABNORMAL LOW (ref 3.5–5.1)
SODIUM: 147 mmol/L — AB (ref 135–145)

## 2018-02-16 LAB — URINE CULTURE

## 2018-02-16 LAB — CBC
HCT: 22.2 % — ABNORMAL LOW (ref 39.0–52.0)
Hemoglobin: 6.5 g/dL — CL (ref 13.0–17.0)
MCH: 26.7 pg (ref 26.0–34.0)
MCHC: 29.3 g/dL — ABNORMAL LOW (ref 30.0–36.0)
MCV: 91.4 fL (ref 78.0–100.0)
PLATELETS: 469 10*3/uL — AB (ref 150–400)
RBC: 2.43 MIL/uL — AB (ref 4.22–5.81)
RDW: 17.4 % — AB (ref 11.5–15.5)
WBC: 16.7 10*3/uL — AB (ref 4.0–10.5)

## 2018-02-16 LAB — PREPARE RBC (CROSSMATCH)

## 2018-02-16 NOTE — Progress Notes (Signed)
Bilateral lower extremity venous duplex has been completed. Negative for DVT.  02/16/18 11:31 AM Olen CordialGreg Alvine Mostafa RVT

## 2018-02-17 LAB — TYPE AND SCREEN
ABO/RH(D): O POS
Antibody Screen: NEGATIVE
UNIT DIVISION: 0

## 2018-02-17 LAB — BPAM RBC
Blood Product Expiration Date: 201904052359
ISSUE DATE / TIME: 201903081548
UNIT TYPE AND RH: 5100

## 2018-02-17 LAB — HEMOGLOBIN AND HEMATOCRIT, BLOOD
HCT: 26.2 % — ABNORMAL LOW (ref 39.0–52.0)
HEMOGLOBIN: 7.9 g/dL — AB (ref 13.0–17.0)

## 2018-02-17 LAB — CULTURE, RESPIRATORY: CULTURE: NORMAL

## 2018-02-17 LAB — POTASSIUM: Potassium: 3.2 mmol/L — ABNORMAL LOW (ref 3.5–5.1)

## 2018-02-17 LAB — CULTURE, RESPIRATORY W GRAM STAIN

## 2018-02-18 LAB — VANCOMYCIN, TROUGH: Vancomycin Tr: 20 ug/mL (ref 15–20)

## 2018-02-19 LAB — CULTURE, BLOOD (ROUTINE X 2)
SPECIAL REQUESTS: ADEQUATE
Special Requests: ADEQUATE

## 2018-02-20 ENCOUNTER — Other Ambulatory Visit (HOSPITAL_COMMUNITY): Payer: Self-pay

## 2018-02-20 LAB — CBC
HCT: 24.5 % — ABNORMAL LOW (ref 39.0–52.0)
Hemoglobin: 7.4 g/dL — ABNORMAL LOW (ref 13.0–17.0)
MCH: 27.7 pg (ref 26.0–34.0)
MCHC: 30.2 g/dL (ref 30.0–36.0)
MCV: 91.8 fL (ref 78.0–100.0)
PLATELETS: 337 10*3/uL (ref 150–400)
RBC: 2.67 MIL/uL — AB (ref 4.22–5.81)
RDW: 17.7 % — AB (ref 11.5–15.5)
WBC: 16.6 10*3/uL — ABNORMAL HIGH (ref 4.0–10.5)

## 2018-02-20 LAB — EXPECTORATED SPUTUM ASSESSMENT W REFEX TO RESP CULTURE

## 2018-02-20 LAB — EXPECTORATED SPUTUM ASSESSMENT W GRAM STAIN, RFLX TO RESP C

## 2018-02-20 NOTE — Consult Note (Signed)
Chief Complaint: Patient was seen in consultation today for percutaneous gastric tube placement at the request of Hijazi,Ali  Referring Physician(s): Hijazi,Ali  Supervising Physician: Gilmer Mor  Patient Status: Select IP  History of Present Illness: Colin Maxwell is a 67 y.o. male   COPD Respiratory failure Influenza; Pulm Embolus Vent  Heparin/Lovenox-- developed GI bleed---now off anticoagulation +trach 02/15/18 Bacteremia + BC staph 02/15/18  Encephalopathy; Protein calorie malnutrition Dysphagia Need for long term care  Request for percutaneous gastric tube placement Dr Bonnielee Haff has reviewed imaging and approves procedure  No past medical history on file.  Allergies: Diphenhydramine; Iodinated diagnostic agents; Metoclopramide; Morphine and related; and Zolpidem  Medications: Prior to Admission medications   Not on File     No family history on file.  Social History   Socioeconomic History  . Marital status: Married    Spouse name: Not on file  . Number of children: Not on file  . Years of education: Not on file  . Highest education level: Not on file  Social Needs  . Financial resource strain: Not on file  . Food insecurity - worry: Not on file  . Food insecurity - inability: Not on file  . Transportation needs - medical: Not on file  . Transportation needs - non-medical: Not on file  Occupational History  . Not on file  Tobacco Use  . Smoking status: Not on file  Substance and Sexual Activity  . Alcohol use: Not on file  . Drug use: Not on file  . Sexual activity: Not on file  Other Topics Concern  . Not on file  Social History Narrative  . Not on file    Review of Systems: A 12 point ROS discussed and pertinent positives are indicated in the HPI above.  All other systems are negative.  Review of Systems  Constitutional:       Trach/vent Ill appearing     Vital Signs: There were no vitals taken for this visit.  Physical Exam    Cardiovascular: Normal rate and regular rhythm.  Pulmonary/Chest:  vent  Abdominal: Soft.  Neurological:  No response  Skin: Skin is warm.  Psychiatric:  Consented wife at bedside  Nursing note and vitals reviewed.   Imaging: Ct Abdomen Wo Contrast  Result Date: 02/20/2018 CLINICAL DATA:  Gastrostomy tube preop EXAM: CT ABDOMEN WITHOUT CONTRAST TECHNIQUE: Multidetector CT imaging of the abdomen was performed following the standard protocol without IV contrast. COMPARISON:  01/30/2018 CT chest. FINDINGS: Lower chest: Dense consolidation in the left lower lobe. Reticulonodular opacities throughout the visualized lungs is stable. Small bilateral pleural effusions. Consolidation in the central and basilar right lower lobe has increased. Hepatobiliary: Postcholecystectomy.  Liver is within normal limits. Pancreas: Unremarkable Spleen: Unremarkable Adrenals/Urinary Tract: Stable left adrenal mass compared to the recent chest CT. Vascular calcifications in the central kidneys are noted. Stomach/Bowel: NG tube is in place. The anterior wall of the stomach is opposed to the anterior abdominal wall. This anatomy is favorable for gastrostomy tube placement. Gas-filled loops of small and large bowel are present without disproportionate dilatation. Postoperative changes from bowel resection are present. Vascular/Lymphatic: Aortic graft repair is noted. No evidence of aortic rupture. Atherosclerotic calcifications of the aorta and visceral vasculature are noted. No abnormal retroperitoneal adenopathy. Other: No free fluid. Musculoskeletal: No wedge compression deformity. IMPRESSION: There is favorable anatomy for gastrostomy tube placement by interventional radiology. Stable left lower lobe consolidation. Worsening right lower lobe consolidation. Stable left adrenal mass. See CT  chest for details and recommendations. Electronically Signed   By: Jolaine Click M.D.   On: 02/20/2018 13:33   Ct Chest W  Contrast  Addendum Date: 01/30/2018   ADDENDUM REPORT: 01/30/2018 13:09 ADDENDUM: I spoke with Dr. Manson Passey, the patient's physician, who stated that they were already aware that the patient had a pulmonary embolus based on study performed at outside institution. Electronically Signed   By: Lupita Raider, M.D.   On: 01/30/2018 13:09   Result Date: 01/30/2018 CLINICAL DATA:  Pneumonia. EXAM: CT CHEST WITH CONTRAST TECHNIQUE: Multidetector CT imaging of the chest was performed during intravenous contrast administration. CONTRAST:  75mL ISOVUE-300 IOPAMIDOL (ISOVUE-300) INJECTION 61% COMPARISON:  Radiograph of January 29, 2018. FINDINGS: Cardiovascular: Atherosclerosis of thoracic aorta is noted without aneurysm or dissection. Coronary artery calcifications are noted. Normal cardiac size. No pericardial effusion. Incidental note is made of filling defect in upper lobe branch of right pulmonary artery consistent with pulmonary embolus. RV/LV ratio of 0.7 which is within normal limits. Mediastinum/Nodes: Endotracheal tube and nasogastric tubes are in grossly good position. Thyroid gland is unremarkable. No significant adenopathy is noted. Lungs/Pleura: No pneumothorax is noted. Emphysematous disease is noted in the upper lobes bilaterally. There appears to be large pneumonia or atelectasis of left lower lobe with minimal associated pleural effusion. Multiple nodular densities are noted in the right lower lobe posteriorly most likely inflammatory in etiology. Upper Abdomen: 2.3 cm left adrenal mass is noted with Hounsfield measurement of 71. Musculoskeletal: No chest wall abnormality. No acute or significant osseous findings. IMPRESSION: Filling defect noted in upper lobe branch of right pulmonary artery consistent with acute pulmonary embolus. Attempts to contact the referring physician or the floor nurse have been unsuccessful. These results will be called to the ordering clinician or representative by the  Radiologist Assistant, and communication documented in the PACS or zVision Dashboard. Large left lower lobe opacity is noted concerning for pneumonia or atelectasis with minimal associated pleural effusion. Endotracheal and nasogastric tubes are in grossly good position. 2.3 cm left adrenal mass is noted; further evaluation with MRI with and without gadolinium is recommended to evaluate for neoplasm. Coronary artery calcifications are noted suggesting coronary artery disease. Aortic Atherosclerosis (ICD10-I70.0) and Emphysema (ICD10-J43.9). Electronically Signed: By: Lupita Raider, M.D. On: 01/30/2018 13:04   Dg Chest Port 1 View  Result Date: 03/01/2018 CLINICAL DATA:  Respiratory distress.  Oxygen desaturation. EXAM: PORTABLE CHEST 1 VIEW COMPARISON:  Radiograph 02/04/2018 FINDINGS: Significant patient rotation to the left. Tracheostomy tube at the thoracic inlet. Enteric tube tip below the diaphragm in the stomach, side-port in the region of the gastroesophageal junction. Progressive bibasilar opacities from prior exam, confluent in the infrahilar regions. Slight worsening interstitial opacities now with Kerley B-lines suspicious for worsening pulmonary edema. Bilateral pleural effusions with slight increase. Unchanged heart size and mediastinal contours. No pneumothorax. IMPRESSION: 1. Progressive interstitial opacities suspicious for increasing pulmonary edema. 2. Progressive bibasilar opacities likely combination of pleural fluid and atelectasis/consolidation. Electronically Signed   By: Rubye Oaks M.D.   On: 02/19/2018 22:39   Dg Chest Port 1 View  Result Date: 02/04/2018 CLINICAL DATA:  Intubation. EXAM: PORTABLE CHEST 1 VIEW COMPARISON:  02/02/2018 FINDINGS: An endotracheal tube is again identified with tip 4 cm above the carina. An OG tube is noted entering the stomach. A RIGHT PICC line is present with tip overlying the LOWER SVC. Bilateral interstitial opacities are again noted and may  represent mild edema. LEFT LOWER lung consolidation/atelectasis again noted.  Trace bilateral pleural effusions are present. IMPRESSION: Support apparatus as described. Bilateral interstitial opacities again noted suspicious for interstitial edema. Continued LEFT LOWER lung consolidation/atelectasis. Electronically Signed   By: Harmon PierJeffrey  Hu M.D.   On: 02/04/2018 21:04   Dg Chest Port 1 View  Result Date: 02/02/2018 CLINICAL DATA:  PICC line EXAM: PORTABLE CHEST 1 VIEW COMPARISON:  01/29/2018 FINDINGS: Endotracheal tube tip is about 4.7 cm superior to the carina. Esophageal tube tip below the diaphragm but is not included. Right upper extremity catheter tip overlies the distal SVC. Continued dense left lower lobe consolidation with patchy infiltrate at the right base. Stable cardiomediastinal silhouette with central vascular congestion. Mild diffuse interstitial opacity could relate to minimal edema. IMPRESSION: 1. Right upper extremity catheter tip overlies the distal SVC 2. No change in dense left lower lobe consolidation with patchy infiltrates at the right base 3. Central vascular prominence. Mild diffuse interstitial opacity could relate to minimal interstitial edema. Electronically Signed   By: Jasmine PangKim  Fujinaga M.D.   On: 02/02/2018 19:10   Dg Chest Port 1 View  Result Date: 01/29/2018 CLINICAL DATA:  Endotracheal tube placement. EXAM: PORTABLE CHEST 1 VIEW COMPARISON:  None. FINDINGS: The heart is upper limits of normal given the AP projection and portable technique. There is tortuosity and calcification of the thoracic aorta. The pulmonary hila are grossly normal. The endotracheal tube is 6.6 cm above the carina. The NG tube is coursing down the esophagus and into the stomach. There is a right PICC line in place and the tip is in the mid SVC. No complicating features. Reticulonodular pattern in the lungs could be due to chronic bronchitis or smoking. Suspect left lower lobe process such as atelectasis or  infiltrate and possible small left pleural effusion. IMPRESSION: 1. The endotracheal tube, NG tube and right PICC line are in good position. 2. Borderline cardiac enlargement. 3. Reticulonodular changes in lungs could be due to chronic bronchitis or smoking. 4. Suspect left lower lobe process such as atelectasis or infiltrate with small effusion. Electronically Signed   By: Rudie MeyerP.  Gallerani M.D.   On: 01/29/2018 20:57   Dg Abd Portable 1v  Result Date: 03/02/2018 CLINICAL DATA:  NG tube placement EXAM: PORTABLE ABDOMEN - 1 VIEW COMPARISON:  01/29/2018 FINDINGS: Interstitial and alveolar opacity at the bases. Esophageal tube tip projects over the mid stomach, side-port in the region of GE junction. Decreased upper abdominal gas. Surgical clips in the central abdomen. IMPRESSION: Esophageal tube tip overlies the proximal to mid stomach, side-port projects over GE junction region. Further advancement could be considered for more optimal positioning. Electronically Signed   By: Jasmine PangKim  Fujinaga M.D.   On: 26-Jul-2018 19:24   Dg Abd Portable 1v  Result Date: 01/29/2018 CLINICAL DATA:  NG tube placement. EXAM: PORTABLE ABDOMEN - 1 VIEW COMPARISON:  None. FINDINGS: The NG tube tip is in the body region of the stomach. Moderate contrast is noted in the distal colon. IMPRESSION: NG tube in the body region of the stomach. Electronically Signed   By: Rudie MeyerP.  Gallerani M.D.   On: 01/29/2018 20:58    Labs:  CBC: Recent Labs    02/10/18 0543 02/11/18 0912 02/13/18 0547 02/16/18 0658 02/17/18 0638  WBC 7.2 8.1 11.8* 16.7*  --   HGB 7.6* 7.4* 7.2* 6.5* 7.9*  HCT 24.7* 25.0* 24.0* 22.2* 26.2*  PLT 212 286 440* 469*  --     COAGS: Recent Labs    02/05/18 0500 02/05/18 1708 02/05/18 2050 02/06/18 0443  02/06/18 0744 02/07/18 0449 02/08/18 0527  INR 3.72  --   --  3.47  --  1.33 1.15  APTT 70* 92* 93* 81* 80*  --   --     BMP: Recent Labs    01/30/18 0709 02/02/18 1602 02/08/18 0527 02/10/18 0543  02/16/18 0658 02/17/18 0638  NA 143  --  141 142 147*  --   K 3.9  --  4.9 3.7 3.0* 3.2*  CL 106  --  99* 102 101  --   CO2 28  --  30 29 35*  --   GLUCOSE 99  --  110* 123* 112*  --   BUN 29*  --  76* 59* 22*  --   CALCIUM 8.4*  --  9.0 8.9 8.6*  --   CREATININE 0.77 0.87 1.62* 1.29* 0.79  --   GFRNONAA >60 >60 42* 56* >60  --   GFRAA >60 >60 49* >60 >60  --     LIVER FUNCTION TESTS: Recent Labs    01/30/18 0709  BILITOT 0.7  AST 17  ALT 18  ALKPHOS 59  PROT 4.8*  ALBUMIN 2.2*    TUMOR MARKERS: No results for input(s): AFPTM, CEA, CA199, CHROMGRNA in the last 8760 hours.  Assessment and Plan:  Dysphagia PCM Deconditioning  Need for long term care Scheduled for perc G tube when appropriate Will check wbc; INR; temp Has been on antibiotics since 3/7 (+BC)  Risks and benefits discussed with the patient's wife including, but not limited to the need for a barium enema during the procedure, bleeding, infection, peritonitis, or damage to adjacent structures.  All of her questions were answered, she is agreeable to proceed. Consent signed and in chart.   Thank you for this interesting consult.  I greatly enjoyed meeting Colin Maxwell and look forward to participating in their care.  A copy of this report was sent to the requesting provider on this date.  Electronically Signed: Robet Leu, PA-C 02/20/2018, 2:20 PM   I spent a total of 40 Minutes    in face to face in clinical consultation, greater than 50% of which was counseling/coordinating care for percutaneous gastric tube placement

## 2018-02-21 LAB — BASIC METABOLIC PANEL
ANION GAP: 7 (ref 5–15)
BUN: 19 mg/dL (ref 6–20)
CALCIUM: 8.5 mg/dL — AB (ref 8.9–10.3)
CO2: 29 mmol/L (ref 22–32)
Chloride: 100 mmol/L — ABNORMAL LOW (ref 101–111)
Creatinine, Ser: 0.59 mg/dL — ABNORMAL LOW (ref 0.61–1.24)
GFR calc non Af Amer: >60 mL/min (ref >60)
GLUCOSE: 124 mg/dL — AB (ref 65–99)
POTASSIUM: 3.2 mmol/L — AB (ref 3.5–5.1)
SODIUM: 136 mmol/L (ref 135–145)

## 2018-02-21 LAB — CBC WITH DIFFERENTIAL/PLATELET
BASOS PCT: 1 %
Basophils Absolute: 0.1 10*3/uL (ref 0.0–0.1)
Eosinophils Absolute: 0.1 10*3/uL (ref 0.0–0.7)
Eosinophils Relative: 0 %
HEMATOCRIT: 23.2 % — AB (ref 39.0–52.0)
Hemoglobin: 6.9 g/dL — CL (ref 13.0–17.0)
Lymphocytes Relative: 11 %
Lymphs Abs: 2.1 10*3/uL (ref 0.7–4.0)
MCH: 27 pg (ref 26.0–34.0)
MCHC: 29.7 g/dL — ABNORMAL LOW (ref 30.0–36.0)
MCV: 90.6 fL (ref 78.0–100.0)
MONO ABS: 1.4 10*3/uL — AB (ref 0.1–1.0)
Monocytes Relative: 7 %
NEUTROS ABS: 15.7 10*3/uL — AB (ref 1.7–7.7)
Neutrophils Relative %: 81 %
PLATELETS: 365 10*3/uL (ref 150–400)
RBC: 2.56 MIL/uL — AB (ref 4.22–5.81)
RDW: 17.4 % — AB (ref 11.5–15.5)
WBC: 19.3 10*3/uL — AB (ref 4.0–10.5)

## 2018-02-21 LAB — PROTIME-INR
INR: 1.23
PROTHROMBIN TIME: 15.4 s — AB (ref 11.4–15.2)

## 2018-02-21 LAB — PREPARE RBC (CROSSMATCH)

## 2018-02-21 LAB — MAGNESIUM: Magnesium: 1.9 mg/dL (ref 1.7–2.4)

## 2018-02-22 LAB — BPAM RBC
Blood Product Expiration Date: 201904082359
ISSUE DATE / TIME: 201903131553
Unit Type and Rh: 5100

## 2018-02-22 LAB — CBC
HCT: 26.6 % — ABNORMAL LOW (ref 39.0–52.0)
Hemoglobin: 8.3 g/dL — ABNORMAL LOW (ref 13.0–17.0)
MCH: 28 pg (ref 26.0–34.0)
MCHC: 31.2 g/dL (ref 30.0–36.0)
MCV: 89.9 fL (ref 78.0–100.0)
PLATELETS: 373 10*3/uL (ref 150–400)
RBC: 2.96 MIL/uL — AB (ref 4.22–5.81)
RDW: 17.5 % — AB (ref 11.5–15.5)
WBC: 19.1 10*3/uL — ABNORMAL HIGH (ref 4.0–10.5)

## 2018-02-22 LAB — TYPE AND SCREEN
ABO/RH(D): O POS
ANTIBODY SCREEN: NEGATIVE
Unit division: 0

## 2018-02-22 LAB — BASIC METABOLIC PANEL
ANION GAP: 9 (ref 5–15)
BUN: 20 mg/dL (ref 6–20)
CALCIUM: 8.6 mg/dL — AB (ref 8.9–10.3)
CO2: 28 mmol/L (ref 22–32)
Chloride: 101 mmol/L (ref 101–111)
Creatinine, Ser: 0.56 mg/dL — ABNORMAL LOW (ref 0.61–1.24)
Glucose, Bld: 126 mg/dL — ABNORMAL HIGH (ref 65–99)
Potassium: 3.4 mmol/L — ABNORMAL LOW (ref 3.5–5.1)
Sodium: 138 mmol/L (ref 135–145)

## 2018-02-22 LAB — POTASSIUM

## 2018-02-22 NOTE — Progress Notes (Signed)
PA to unit to assess patient for possible gastrostomy tube placement.   Patient with elevated WBC today-- up to 19K.  Also with positive sputum cultures and decreased HgB.   Family at bedside concerned for ongoing deterioration and lack of improvement.  They would like to hold on G-tube placement at this time.  Patient not medically ready.  If becomes appropriate for placement, verify with family that they wish to move forward.   IR will follow.   Loyce DysKacie Matthews, MS RD PA-C 11:29 AM

## 2018-02-23 LAB — CULTURE, RESPIRATORY

## 2018-02-23 LAB — CULTURE, RESPIRATORY W GRAM STAIN

## 2018-02-23 LAB — POTASSIUM: Potassium: 3.6 mmol/L (ref 3.5–5.1)

## 2018-02-25 LAB — CBC
HEMATOCRIT: 25.6 % — AB (ref 39.0–52.0)
Hemoglobin: 7.9 g/dL — ABNORMAL LOW (ref 13.0–17.0)
MCH: 28 pg (ref 26.0–34.0)
MCHC: 30.9 g/dL (ref 30.0–36.0)
MCV: 90.8 fL (ref 78.0–100.0)
PLATELETS: 433 10*3/uL — AB (ref 150–400)
RBC: 2.82 MIL/uL — ABNORMAL LOW (ref 4.22–5.81)
RDW: 17.3 % — AB (ref 11.5–15.5)
WBC: 21.6 10*3/uL — ABNORMAL HIGH (ref 4.0–10.5)

## 2018-03-02 LAB — CULTURE, BLOOD (ROUTINE X 2)
Culture: NO GROWTH
Culture: NO GROWTH

## 2018-03-12 DEATH — deceased

## 2019-11-12 IMAGING — DX DG ABD PORTABLE 1V
1 series · 1 of 1 positions shown · non-contrast
Comparison: 01/29/2018

CLINICAL DATA: NG tube placement

EXAM:
PORTABLE ABDOMEN - 1 VIEW

[abdomen kub]
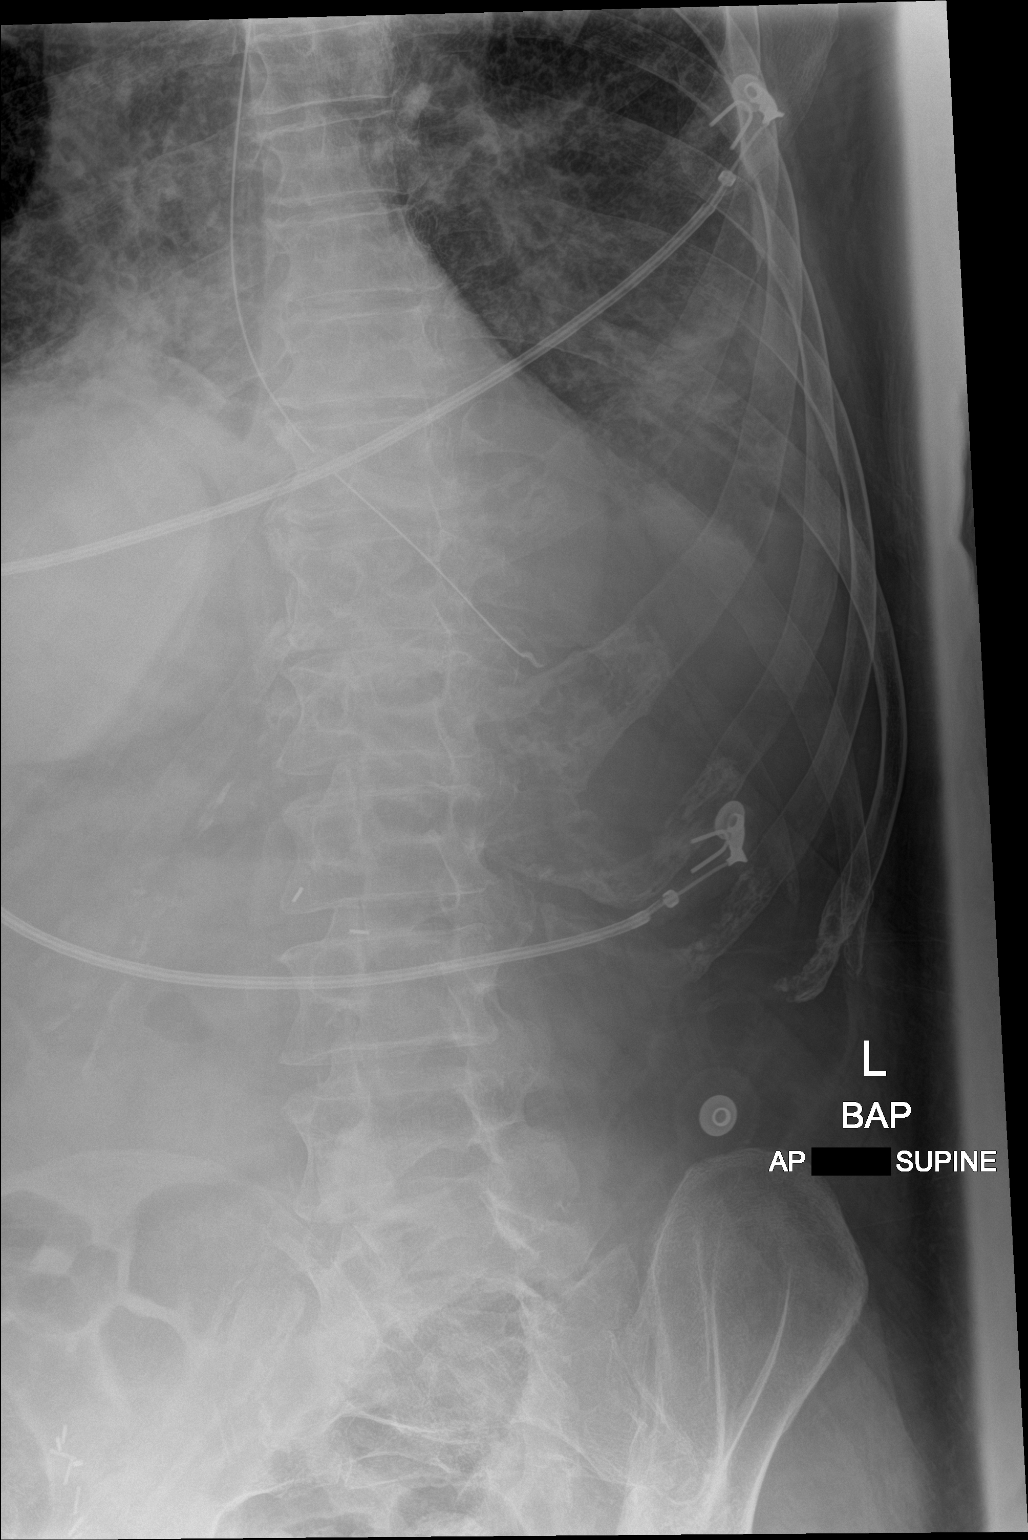

[1 of 1 positions shown; findings below may reference images not displayed]

FINDINGS: Interstitial and alveolar opacity at the bases. Esophageal tube tip
projects over the mid stomach, side-port in the region of GE
junction. Decreased upper abdominal gas. Surgical clips in the
central abdomen.
IMPRESSION: Esophageal tube tip overlies the proximal to mid stomach, side-port
projects over GE junction region. Further advancement could be
considered for more optimal positioning.
